# Patient Record
Sex: Female | Born: 1995 | Hispanic: Yes | Marital: Single | Smoking: Never smoker
Health system: Southern US, Community
[De-identification: ages and names within clinical notes are randomized; demographics above are authoritative.]

## PROBLEM LIST (undated history)

## (undated) DIAGNOSIS — L409 Psoriasis, unspecified: Secondary | ICD-10-CM

## (undated) HISTORY — DX: Psoriasis, unspecified: L40.9

---
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
## 2015-03-07 ENCOUNTER — Encounter: Payer: Self-pay | Admitting: Emergency Medicine

## 2015-03-07 ENCOUNTER — Emergency Department: Payer: Self-pay

## 2015-03-07 ENCOUNTER — Emergency Department
Admission: EM | Admit: 2015-03-07 | Discharge: 2015-03-07 | Disposition: A | Discharge status: Home / Self Care | Payer: Self-pay | Attending: Emergency Medicine | Admitting: Emergency Medicine

## 2015-03-07 DIAGNOSIS — W2201XA Walked into wall, initial encounter: Secondary | ICD-10-CM | POA: Insufficient documentation

## 2015-03-07 DIAGNOSIS — S60221A Contusion of right hand, initial encounter: Secondary | ICD-10-CM | POA: Insufficient documentation

## 2015-03-07 DIAGNOSIS — M79641 Pain in right hand: Secondary | ICD-10-CM

## 2015-03-07 DIAGNOSIS — Y998 Other external cause status: Secondary | ICD-10-CM | POA: Insufficient documentation

## 2015-03-07 DIAGNOSIS — Y9389 Activity, other specified: Secondary | ICD-10-CM | POA: Insufficient documentation

## 2015-03-07 DIAGNOSIS — Y9289 Other specified places as the place of occurrence of the external cause: Secondary | ICD-10-CM | POA: Insufficient documentation

## 2015-03-07 NOTE — ED Provider Notes (Signed)
[facility] Emergency Department Provider Note    ____________________________________________  Time seen: 0515  I have reviewed the triage vital signs and the nursing notes.   HISTORY  Chief Complaint Hand Injury   History limited by: Not Limited   HPI Amy Huff is a 19 y.o. female who presents to the emergency department today because of concerns for right hand pain. The patient states she punched a wooden door earlier tonight. She states she was angry. She states that right after punching it she developed pain around her knuckles. She states the pain is worse around her fifth digit knuckle. The pain has been constant. She tried icing it without great relief. She denies any other injuries. She states she has a previous wrist injury to that hand.  History reviewed. No pertinent past medical history.  There are no active problems to display for this patient.   History reviewed. No pertinent past surgical history.  No current outpatient prescriptions on file.  Allergies Review of patient's allergies indicates no known allergies.  No family history on file.  Social History Social History  Substance Use Topics  . Smoking status: Never Smoker   . Smokeless tobacco: Never Used  . Alcohol Use: No    Review of Systems  Constitutional: Negative for fever. Cardiovascular: Negative for chest pain. Respiratory: Negative for shortness of breath. Gastrointestinal: Negative for abdominal pain, vomiting and diarrhea. Neurological: Negative for headaches, focal weakness or numbness.   10-point ROS otherwise negative.  ____________________________________________   PHYSICAL EXAM:  VITAL SIGNS: ED Triage Vitals  Enc Vitals Group     BP 03/07/15 0344 135/87 mmHg     Pulse Rate 03/07/15 0344 83     Resp 03/07/15 0344 16     Temp 03/07/15 0344 98.5 F (36.9 C)     Temp Source 03/07/15 0344 Oral     SpO2 03/07/15 0344 100 %     Weight  03/07/15 0344 160 lb (72.576 kg)     Height 03/07/15 0344 5\' 5"  (1.651 m)     Head Cir --      Peak Flow --      Pain Score 03/07/15 0345 7   Constitutional: Alert and oriented. Well appearing and in no distress. Eyes: Conjunctivae are normal. PERRL. Normal extraocular movements. ENT   Head: Normocephalic and atraumatic.   Nose: No congestion/rhinnorhea.   Mouth/Throat: Mucous membranes are moist.   Neck: No stridor. Hematological/Lymphatic/Immunilogical: No cervical lymphadenopathy. Cardiovascular: Normal rate, regular rhythm.  No murmurs, rubs, or gallops. Respiratory: Normal respiratory effort without tachypnea nor retractions. Breath sounds are clear and equal bilaterally. No wheezes/rales/rhonchi. Musculoskeletal: Some bruising to the third through fifth knuckles of her right hand. Some tenderness to palpation about the fifth knuckle however no gross deformity. Sensation intact. Grip strength intact. Neurologic:  Normal speech and language. No gross focal neurologic deficits are appreciated.  Skin:  Skin is warm, dry and intact. No rash noted. Psychiatric: Mood and affect are normal. Speech and behavior are normal. Patient exhibits appropriate insight and judgment.  ____________________________________________    LABS (pertinent positives/negatives)  None  ____________________________________________   EKG  None  ____________________________________________    RADIOLOGY  CXR IMPRESSION: No evidence of fracture or dislocation.  I, Darnell Jeschke, personally viewed and evaluated these images (plain radiographs) as part of my medical decision making.   ____________________________________________   PROCEDURES  Procedure(s) performed: None  Critical Care performed: No  ____________________________________________   INITIAL IMPRESSION / ASSESSMENT AND PLAN /  ED COURSE  Pertinent labs & imaging results that were available during my care of the  patient were reviewed by me and considered in my medical decision making (see chart for details).  Patient presented to the emergency department today complaining of right hand pain after punching a door. X-ray did not show any acute osseous injuries. No other injuries. Will discharge home.  ____________________________________________   FINAL CLINICAL IMPRESSION(S) / ED DIAGNOSES  Final diagnoses:  Hand pain, right     Phineas Semen, MD 03/07/15 0522

## 2015-03-07 NOTE — Discharge Instructions (Signed)
Please seek medical attention for any high fevers, chest pain, shortness of breath, change in behavior, persistent vomiting, bloody stool or any other new or concerning symptoms. ° ° °Musculoskeletal Pain °Musculoskeletal pain is muscle and boney aches and pains. These pains can occur in any part of the body. Your caregiver may treat you without knowing the cause of the pain. They may treat you if blood or urine tests, X-rays, and other tests were normal.  °CAUSES °There is often not a definite cause or reason for these pains. These pains may be caused by a type of germ (virus). The discomfort may also come from overuse. Overuse includes working out too hard when your body is not fit. Boney aches also come from weather changes. Bone is sensitive to atmospheric pressure changes. °HOME CARE INSTRUCTIONS  °· Ask when your test results will be ready. Make sure you get your test results. °· Only take over-the-counter or prescription medicines for pain, discomfort, or fever as directed by your caregiver. If you were given medications for your condition, do not drive, operate machinery or power tools, or sign legal documents for 24 hours. Do not drink alcohol. Do not take sleeping pills or other medications that may interfere with treatment. °· Continue all activities unless the activities cause more pain. When the pain lessens, slowly resume normal activities. Gradually increase the intensity and duration of the activities or exercise. °· During periods of severe pain, bed rest may be helpful. Lay or sit in any position that is comfortable. °· Putting ice on the injured area. °¨ Put ice in a bag. °¨ Place a towel between your skin and the bag. °¨ Leave the ice on for 15 to 20 minutes, 3 to 4 times a day. °· Follow up with your caregiver for continued problems and no reason can be found for the pain. If the pain becomes worse or does not go away, it may be necessary to repeat tests or do additional testing. Your caregiver  may need to look further for a possible cause. °SEEK IMMEDIATE MEDICAL CARE IF: °· You have pain that is getting worse and is not relieved by medications. °· You develop chest pain that is associated with shortness or breath, sweating, feeling sick to your stomach (nauseous), or throw up (vomit). °· Your pain becomes localized to the abdomen. °· You develop any new symptoms that seem different or that concern you. °MAKE SURE YOU:  °· Understand these instructions. °· Will watch your condition. °· Will get help right away if you are not doing well or get worse. °  °This information is not intended to replace advice given to you by your health care provider. Make sure you discuss any questions you have with your health care provider. °  °Document Released: 02/28/2005 Document Revised: 05/23/2011 Document Reviewed: 11/02/2012 °Elsevier Interactive Patient Education ©2016 [employer] ° °

## 2015-03-07 NOTE — ED Notes (Addendum)
Pt states punched a wood wall approx 1 hour pta. Pt complains of posterior base of right knuckles pain and hand pain. Cms intact to fingers, ice applied in triage.

## 2017-06-15 IMAGING — CR DG HAND COMPLETE 3+V*R*
1 series · 3 of 3 positions shown · non-contrast
Comparison: None.

CLINICAL DATA: Punched Thijs Notenboom, with right fifth metacarpal pain.
Initial encounter.

EXAM:
RIGHT HAND - COMPLETE 3+ VIEW

[Series 1: x hand pa right · 0.14mm/px · 3 of 3 slices shown]
[im 1/3]
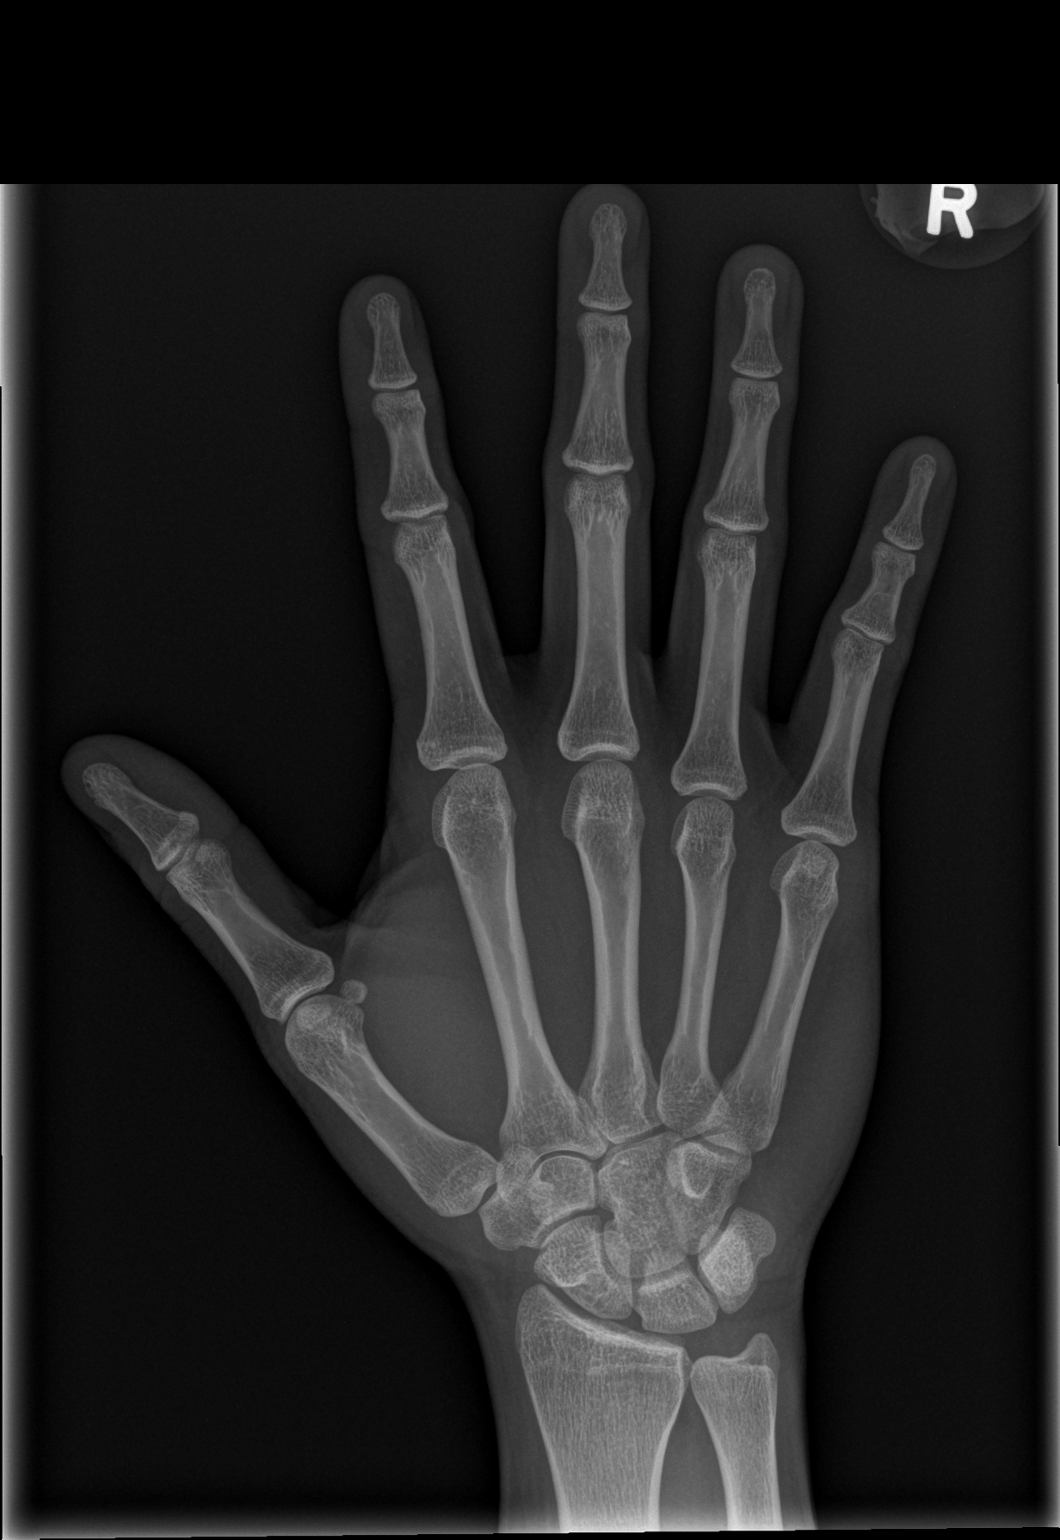
[im 2/3]
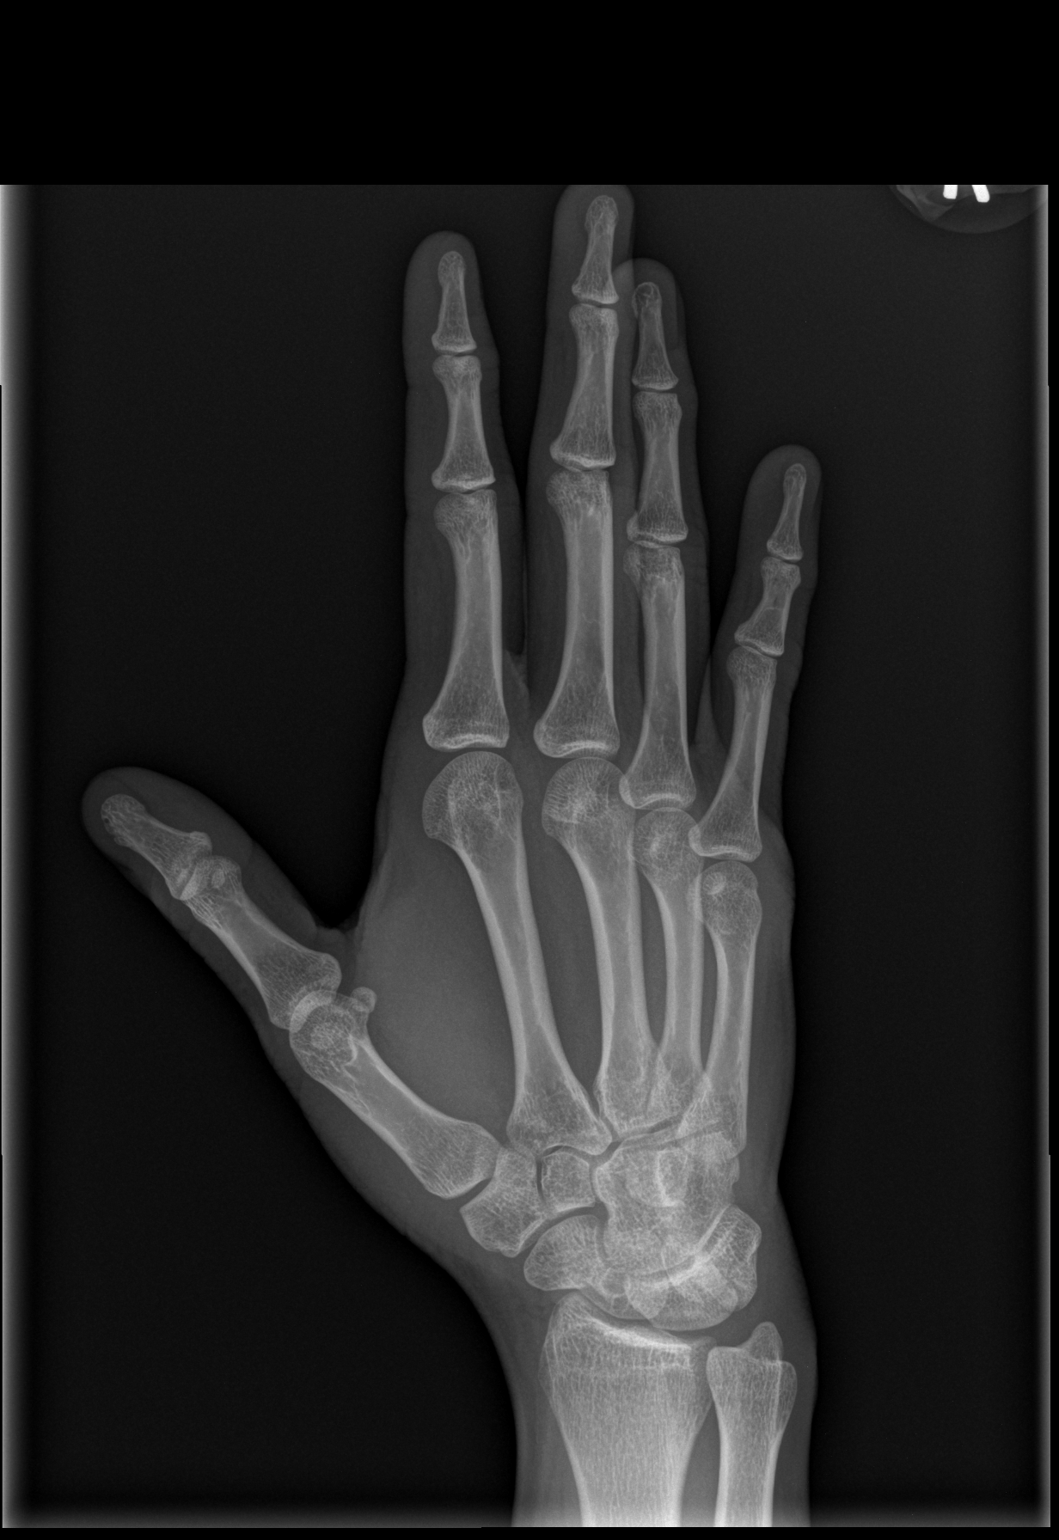
[im 3/3]
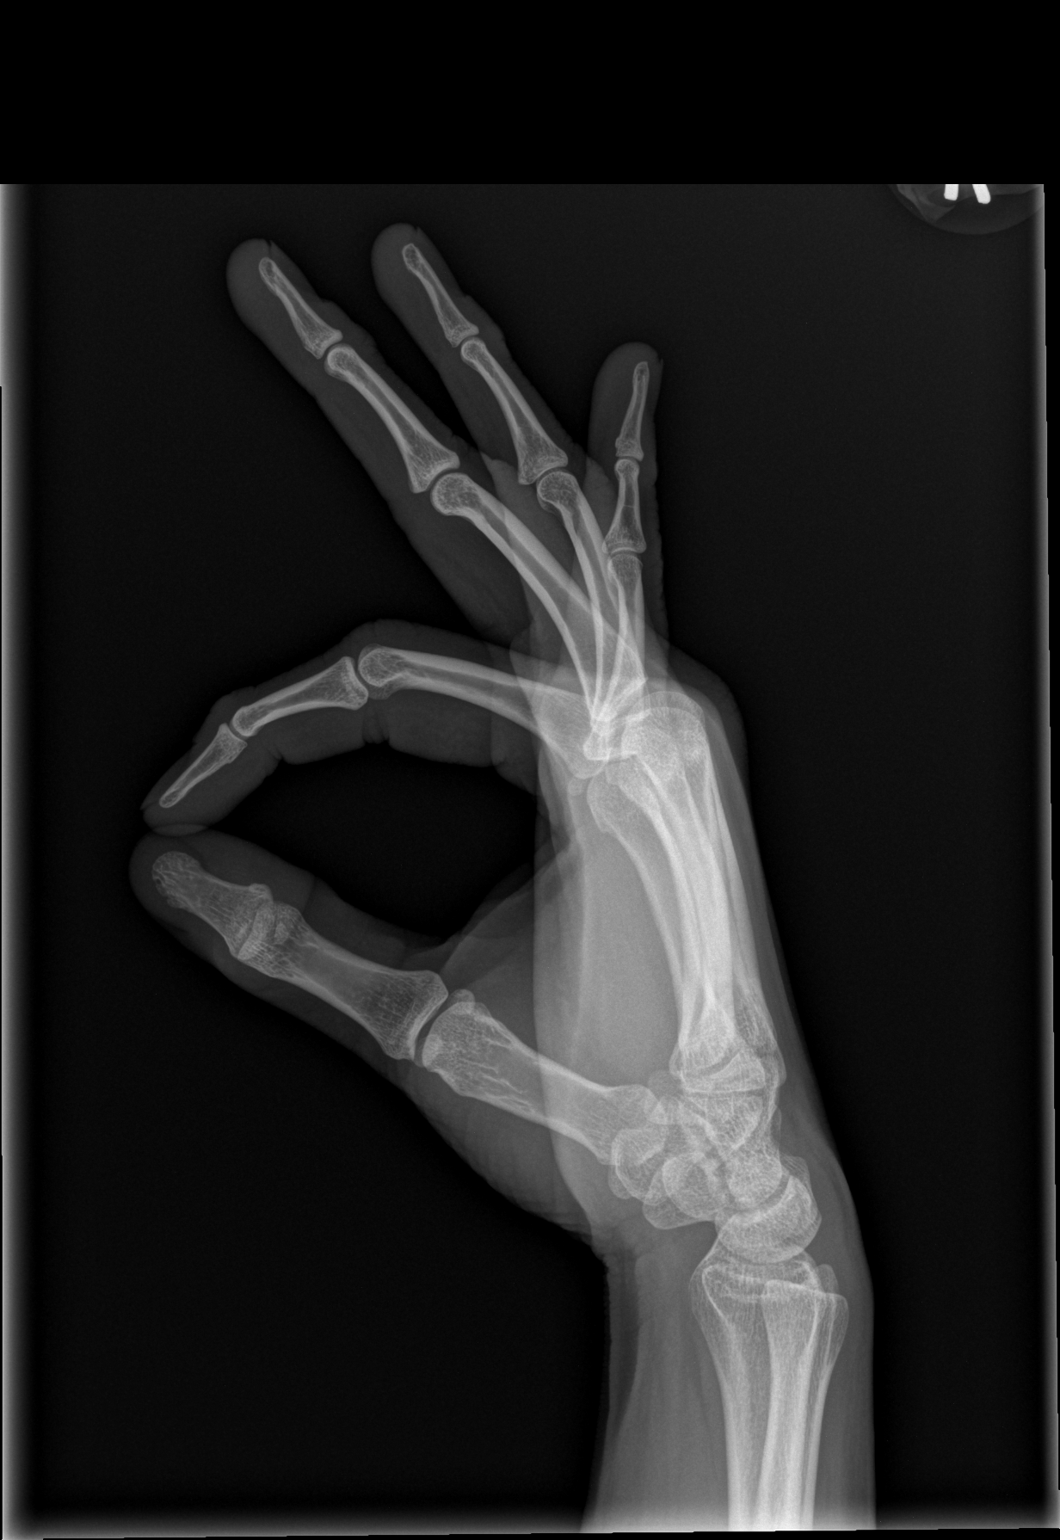

[3 of 3 positions shown; findings below may reference images not displayed]

FINDINGS: There is no evidence of fracture or dislocation. The joint spaces
are preserved. The carpal rows are intact, and demonstrate normal
alignment. The soft tissues are unremarkable in appearance.
IMPRESSION: No evidence of fracture or dislocation.

## 2020-12-03 DIAGNOSIS — Z7689 Persons encountering health services in other specified circumstances: Secondary | ICD-10-CM | POA: Diagnosis not present

## 2020-12-03 DIAGNOSIS — Z79899 Other long term (current) drug therapy: Secondary | ICD-10-CM | POA: Diagnosis not present

## 2020-12-03 DIAGNOSIS — L400 Psoriasis vulgaris: Secondary | ICD-10-CM | POA: Diagnosis not present

## 2020-12-10 DIAGNOSIS — Z7689 Persons encountering health services in other specified circumstances: Secondary | ICD-10-CM | POA: Diagnosis not present

## 2021-04-07 DIAGNOSIS — F4310 Post-traumatic stress disorder, unspecified: Secondary | ICD-10-CM | POA: Diagnosis not present

## 2021-04-13 DIAGNOSIS — Z1329 Encounter for screening for other suspected endocrine disorder: Secondary | ICD-10-CM | POA: Diagnosis not present

## 2021-04-13 DIAGNOSIS — Z130 Encounter for screening for diseases of the blood and blood-forming organs and certain disorders involving the immune mechanism: Secondary | ICD-10-CM | POA: Diagnosis not present

## 2021-04-13 DIAGNOSIS — Z131 Encounter for screening for diabetes mellitus: Secondary | ICD-10-CM | POA: Diagnosis not present

## 2021-04-13 DIAGNOSIS — Z124 Encounter for screening for malignant neoplasm of cervix: Secondary | ICD-10-CM | POA: Diagnosis not present

## 2021-04-13 DIAGNOSIS — Z113 Encounter for screening for infections with a predominantly sexual mode of transmission: Secondary | ICD-10-CM | POA: Diagnosis not present

## 2021-04-13 DIAGNOSIS — Z01419 Encounter for gynecological examination (general) (routine) without abnormal findings: Secondary | ICD-10-CM | POA: Diagnosis not present

## 2021-04-13 DIAGNOSIS — Z13220 Encounter for screening for lipoid disorders: Secondary | ICD-10-CM | POA: Diagnosis not present

## 2021-04-13 DIAGNOSIS — Z1321 Encounter for screening for nutritional disorder: Secondary | ICD-10-CM | POA: Diagnosis not present

## 2021-04-14 DIAGNOSIS — F4310 Post-traumatic stress disorder, unspecified: Secondary | ICD-10-CM | POA: Diagnosis not present

## 2021-04-21 DIAGNOSIS — F4310 Post-traumatic stress disorder, unspecified: Secondary | ICD-10-CM | POA: Diagnosis not present

## 2021-04-28 DIAGNOSIS — F4310 Post-traumatic stress disorder, unspecified: Secondary | ICD-10-CM | POA: Diagnosis not present

## 2021-05-05 DIAGNOSIS — F4310 Post-traumatic stress disorder, unspecified: Secondary | ICD-10-CM | POA: Diagnosis not present

## 2021-05-12 DIAGNOSIS — F4310 Post-traumatic stress disorder, unspecified: Secondary | ICD-10-CM | POA: Diagnosis not present

## 2021-05-19 DIAGNOSIS — F4310 Post-traumatic stress disorder, unspecified: Secondary | ICD-10-CM | POA: Diagnosis not present

## 2021-05-26 DIAGNOSIS — F4310 Post-traumatic stress disorder, unspecified: Secondary | ICD-10-CM | POA: Diagnosis not present

## 2021-06-09 DIAGNOSIS — F4310 Post-traumatic stress disorder, unspecified: Secondary | ICD-10-CM | POA: Diagnosis not present

## 2021-06-30 DIAGNOSIS — Z79899 Other long term (current) drug therapy: Secondary | ICD-10-CM | POA: Diagnosis not present

## 2021-06-30 DIAGNOSIS — L400 Psoriasis vulgaris: Secondary | ICD-10-CM | POA: Diagnosis not present

## 2021-06-30 DIAGNOSIS — F4310 Post-traumatic stress disorder, unspecified: Secondary | ICD-10-CM | POA: Diagnosis not present

## 2021-07-07 DIAGNOSIS — F4310 Post-traumatic stress disorder, unspecified: Secondary | ICD-10-CM | POA: Diagnosis not present

## 2021-07-14 DIAGNOSIS — F4310 Post-traumatic stress disorder, unspecified: Secondary | ICD-10-CM | POA: Diagnosis not present

## 2021-07-20 DIAGNOSIS — R509 Fever, unspecified: Secondary | ICD-10-CM | POA: Diagnosis not present

## 2021-07-20 DIAGNOSIS — J36 Peritonsillar abscess: Secondary | ICD-10-CM | POA: Diagnosis not present

## 2021-07-20 DIAGNOSIS — Z20822 Contact with and (suspected) exposure to covid-19: Secondary | ICD-10-CM | POA: Diagnosis not present

## 2021-07-28 DIAGNOSIS — F4310 Post-traumatic stress disorder, unspecified: Secondary | ICD-10-CM | POA: Diagnosis not present

## 2021-08-11 DIAGNOSIS — F4310 Post-traumatic stress disorder, unspecified: Secondary | ICD-10-CM | POA: Diagnosis not present

## 2021-09-01 DIAGNOSIS — F4310 Post-traumatic stress disorder, unspecified: Secondary | ICD-10-CM | POA: Diagnosis not present

## 2021-09-22 DIAGNOSIS — F4310 Post-traumatic stress disorder, unspecified: Secondary | ICD-10-CM | POA: Diagnosis not present

## 2021-10-06 DIAGNOSIS — F4310 Post-traumatic stress disorder, unspecified: Secondary | ICD-10-CM | POA: Diagnosis not present

## 2021-10-27 DIAGNOSIS — F4310 Post-traumatic stress disorder, unspecified: Secondary | ICD-10-CM | POA: Diagnosis not present

## 2021-11-17 DIAGNOSIS — F4310 Post-traumatic stress disorder, unspecified: Secondary | ICD-10-CM | POA: Diagnosis not present

## 2021-12-08 DIAGNOSIS — F4310 Post-traumatic stress disorder, unspecified: Secondary | ICD-10-CM | POA: Diagnosis not present

## 2021-12-29 DIAGNOSIS — F4310 Post-traumatic stress disorder, unspecified: Secondary | ICD-10-CM | POA: Diagnosis not present

## 2022-01-18 DIAGNOSIS — L400 Psoriasis vulgaris: Secondary | ICD-10-CM | POA: Diagnosis not present

## 2022-01-18 DIAGNOSIS — Z79899 Other long term (current) drug therapy: Secondary | ICD-10-CM | POA: Diagnosis not present

## 2022-01-19 DIAGNOSIS — L400 Psoriasis vulgaris: Secondary | ICD-10-CM | POA: Diagnosis not present

## 2022-01-19 DIAGNOSIS — Z79899 Other long term (current) drug therapy: Secondary | ICD-10-CM | POA: Diagnosis not present

## 2022-01-26 DIAGNOSIS — F4310 Post-traumatic stress disorder, unspecified: Secondary | ICD-10-CM | POA: Diagnosis not present

## 2022-02-16 DIAGNOSIS — F4310 Post-traumatic stress disorder, unspecified: Secondary | ICD-10-CM | POA: Diagnosis not present

## 2022-02-18 ENCOUNTER — Ambulatory Visit
Admission: EM | Admit: 2022-02-18 | Discharge: 2022-02-18 | Disposition: A | Discharge status: Home / Self Care | Payer: BC Managed Care – PPO

## 2022-02-18 DIAGNOSIS — R0981 Nasal congestion: Secondary | ICD-10-CM | POA: Diagnosis not present

## 2022-02-18 DIAGNOSIS — R051 Acute cough: Secondary | ICD-10-CM | POA: Diagnosis not present

## 2022-02-18 DIAGNOSIS — H6502 Acute serous otitis media, left ear: Secondary | ICD-10-CM

## 2022-02-18 MED ORDER — AMOXICILLIN-POT CLAVULANATE 875-125 MG PO TABS: 1.0000 | ORAL_TABLET | Freq: Two times a day (BID) | ORAL | 0 refills | Status: AC

## 2022-02-18 NOTE — ED Provider Notes (Signed)
[facility]    CSN: [id number] Arrival date & time: 02/18/22  1045      History   Chief Complaint Chief Complaint  Patient presents with   Ear Pain   Nasal Congestion    HPI Amy Huff is a 26 y.o. female presenting for little greater than 1 week history of left-sided ear pain/pressure, nasal congestion, cough, sinus pressure, sore throat, reduced hearing in the left ear.  Reports symptoms recently worsened over the past couple days.  Has been taking decongestants and occasionally using nasal spray over-the-counter without improvement.  No other complaints.  HPI  History reviewed. No pertinent past medical history.  There are no problems to display for this patient.   History reviewed. No pertinent surgical history.  OB History   No obstetric history on file.      Home Medications    Prior to Admission medications   Medication Sig Start Date End Date Taking? Authorizing Provider  amoxicillin-clavulanate (AUGMENTIN) 875-125 MG tablet Take 1 tablet by mouth every 12 (twelve) hours for 7 days. 02/18/22 02/25/22 Yes Danton Clap, PA-C  Certolizumab Pegol (CIMZIA STARTER KIT) 6 X 200 MG/ML PSKT Cimzia 400 mg/2 mL (200 mg/mL x 2) subcutaneous syringe kit 01/14/21  Yes [provider]  levonorgestrel-ethinyl estradiol (DOLISHALE) 90-20 MCG tablet Take 1 tablet by mouth daily.   Yes [provider]  Vitamin D, Ergocalciferol, (DRISDOL) 1.25 MG (50000 UNIT) CAPS capsule Take 50,000 Units by mouth once a week. 02/13/22  Yes [provider]    Family History History reviewed. No pertinent family history.  Social History Social History   Tobacco Use   Smoking status: Never   Smokeless tobacco: Never  Vaping Use   Vaping Use: Never used  Substance Use Topics   Alcohol use: No   Drug use: No     Allergies   Patient has no known allergies.   Review of Systems Review of Systems  Constitutional:  Negative for chills,  diaphoresis, fatigue and fever.  HENT:  Positive for congestion, ear pain, hearing loss, postnasal drip, rhinorrhea and sore throat. Negative for sinus pressure and sinus pain.   Respiratory:  Positive for cough. Negative for shortness of breath.   Gastrointestinal:  Negative for abdominal pain, nausea and vomiting.  Musculoskeletal:  Negative for arthralgias and myalgias.  Skin:  Negative for rash.  Neurological:  Negative for weakness and headaches.  Hematological:  Negative for adenopathy.     Physical Exam Triage Vital Signs ED Triage Vitals  Enc Vitals Group     BP 02/18/22 1210 (!) 147/97     Pulse Rate 02/18/22 1210 82     Resp 02/18/22 1210 18     Temp 02/18/22 1210 98.3 F (36.8 C)     Temp Source 02/18/22 1210 Oral     SpO2 02/18/22 1210 97 %     Weight 02/18/22 1208 180 lb (81.6 kg)     Height 02/18/22 1208 _0  (1.651 m)     Head Circumference --      Peak Flow --      Pain Score 02/18/22 1207 6     Pain Loc --      Pain Edu? --      Excl. in [place]? --    No data found.  Updated Vital Signs BP (!) 147/97 (BP Location: Left Arm)   Pulse 82   Temp 98.3 F (36.8 C) (Oral)   Resp 18   Ht _1  (  1.651 m)   Wt 180 lb (81.6 kg)   SpO2 97%   BMI 29.95 kg/m     Physical Exam Vitals and nursing note reviewed.  Constitutional:      General: She is not in acute distress.    Appearance: Normal appearance. She is not ill-appearing or toxic-appearing.  HENT:     Head: Normocephalic and atraumatic.     Right Ear: Ear canal and external ear normal. A middle ear effusion is present.     Left Ear: Ear canal and external ear normal. A middle ear effusion is present. Tympanic membrane is injected and bulging.     Nose: Congestion present.     Mouth/Throat:     Mouth: Mucous membranes are moist.     Pharynx: Oropharynx is clear. No posterior oropharyngeal erythema.  Eyes:     General: No scleral icterus.       Right eye: No discharge.        Left eye: No discharge.      Conjunctiva/sclera: Conjunctivae normal.  Cardiovascular:     Rate and Rhythm: Normal rate and regular rhythm.     Heart sounds: Normal heart sounds.  Pulmonary:     Effort: Pulmonary effort is normal. No respiratory distress.     Breath sounds: Normal breath sounds.  Musculoskeletal:     Cervical back: Neck supple.  Skin:    General: Skin is dry.  Neurological:     General: No focal deficit present.     Mental Status: She is alert. Mental status is at baseline.     Motor: No weakness.     Gait: Gait normal.  Psychiatric:        Mood and Affect: Mood normal.        Behavior: Behavior normal.        Thought Content: Thought content normal.      UC Treatments / Results  Labs (all labs ordered are listed, but only abnormal results are displayed) Labs Reviewed - No data to display  EKG   Radiology No results found.  Procedures Procedures (including critical care time)  Medications Ordered in UC Medications - No data to display  Initial Impression / Assessment and Plan / UC Course  I have reviewed the triage vital signs and the nursing notes.  Pertinent labs & imaging results that were available during my care of the patient were reviewed by me and considered in my medical decision making (see chart for details).   26 year old female presenting for greater than 1 week history of left-sided ear pain/pressure, sore throat, cough, nasal congestion and sinus pressure.  Exam she has erythema mildly/injection of the left TM with clear effusion and bulging TM.  Also has nasal congestion and clear effusion on the right.  Suspect viral URI and acute serous otitis media which I discussed with patient.  Advised her to continue the decongestants and begin using Flonase and nasal saline every day.  I printed a prescription for Augmentin in case her symptoms are not improving over the next week or they worsen.   Final Clinical Impressions(s) / UC Diagnoses   Final diagnoses:   Acute serous otitis media of left ear, recurrence not specified  Acute cough  Nasal congestion     Discharge Instructions      -Symptoms most likely due to a viral.  Suggest continuing Sudafed and starting Flonase every day and nasal saline rinses.  I printed prescription for antibiotic in case you are not starting  to improve the next 2 days or symptoms worsen.  As we discussed, lower suspicion for bacterial ear infection at this time.     ED Prescriptions     Medication Sig Dispense Auth. Provider   amoxicillin-clavulanate (AUGMENTIN) 875-125 MG tablet Take 1 tablet by mouth every 12 (twelve) hours for 7 days. 14 tablet Gretta Cool      PDMP not reviewed this encounter.   Danton Clap, PA-C 02/18/22 1248

## 2022-02-18 NOTE — ED Triage Notes (Signed)
Pt c/o congestion and nasal draiange, cough, sore throat, ear pain and loss of hearing x1week

## 2022-02-18 NOTE — Discharge Instructions (Signed)
-  Symptoms most likely due to a viral.  Suggest continuing Sudafed and starting Flonase every day and nasal saline rinses.  I printed prescription for antibiotic in case you are not starting to improve the next 2 days or symptoms worsen.  As we discussed, lower suspicion for bacterial ear infection at this time.

## 2022-03-23 DIAGNOSIS — F4310 Post-traumatic stress disorder, unspecified: Secondary | ICD-10-CM | POA: Diagnosis not present

## 2022-04-13 DIAGNOSIS — F4310 Post-traumatic stress disorder, unspecified: Secondary | ICD-10-CM | POA: Diagnosis not present

## 2022-04-14 DIAGNOSIS — Z0000 Encounter for general adult medical examination without abnormal findings: Secondary | ICD-10-CM | POA: Diagnosis not present

## 2022-04-14 LAB — HM PAP SMEAR: HM Pap smear: NORMAL

## 2022-04-21 ENCOUNTER — Ambulatory Visit: Payer: BC Managed Care – PPO | Admitting: Family Medicine

## 2022-04-21 ENCOUNTER — Encounter: Payer: Self-pay | Admitting: Family Medicine

## 2022-04-21 VITALS — BP 128/78 | HR 100 | Ht 65.0 in | Wt 183.0 lb

## 2022-04-21 DIAGNOSIS — L400 Psoriasis vulgaris: Secondary | ICD-10-CM | POA: Diagnosis not present

## 2022-04-21 DIAGNOSIS — Z1159 Encounter for screening for other viral diseases: Secondary | ICD-10-CM

## 2022-04-21 DIAGNOSIS — E559 Vitamin D deficiency, unspecified: Secondary | ICD-10-CM | POA: Diagnosis not present

## 2022-04-21 DIAGNOSIS — Z13220 Encounter for screening for lipoid disorders: Secondary | ICD-10-CM | POA: Diagnosis not present

## 2022-04-21 DIAGNOSIS — Z0000 Encounter for general adult medical examination without abnormal findings: Secondary | ICD-10-CM | POA: Diagnosis not present

## 2022-04-21 DIAGNOSIS — Z114 Encounter for screening for human immunodeficiency virus [HIV]: Secondary | ICD-10-CM | POA: Diagnosis not present

## 2022-04-21 NOTE — Assessment & Plan Note (Signed)
Well controlled on current regimen, will follow peripherally.

## 2022-04-21 NOTE — Patient Instructions (Signed)
-   Obtain fasting labs with orders provided (can have water or black coffee but otherwise no food or drink x 8 hours before labs) °- Review information provided °- Attend eye doctor annually, dentist every 6 months, work towards or maintain 30 minutes of moderate intensity physical activity at least 5 days per week, and consume a balanced diet °- Return in 1 year for physical °- Contact us for any questions between now and then °

## 2022-04-21 NOTE — Progress Notes (Signed)
Annual Physical Exam Visit  Patient Information:  Patient ID: Amy Huff, female DOB: February 21, 1996 Age: 27 y.o. MRN: [id number]   Subjective:   CC: Annual Physical Exam  HPI:  Amy Huff is here for their annual physical.  I reviewed the past medical history, family history, social history, surgical history, and allergies today and changes were made as necessary.  Please see the problem list section below for additional details.  Past Medical History: Past Medical History:  Diagnosis Date   Psoriasis    Past Surgical History: Past Surgical History:  Procedure Laterality Date   WISDOM TOOTH EXTRACTION Bilateral 2012   Family History: Family History  Problem Relation Age of Onset   Diabetes Mother    Cancer Maternal Grandmother    Dementia Paternal Grandfather    Allergies: No Known Allergies Health Maintenance: Health Maintenance  Topic Date Due   COVID-19 Vaccine (1) 08/26/2019   INFLUENZA VACCINE  Never done   Hepatitis C Screening  04/22/2023 (Originally 09/11/2012)   PAP-Cervical Cytology Screening  04/14/2025   PAP SMEAR-Modifier  04/14/2025   HIV Screening  Completed   HPV VACCINES  Aged Out   DTaP/Tdap/Td  Discontinued    HM Colonoscopy     This patient has no relevant Health Maintenance data.      Medications: Current Outpatient Medications on File Prior to Visit  Medication Sig Dispense Refill   Certolizumab Pegol (CIMZIA STARTER KIT) 6 X 200 MG/ML PSKT Cimzia 400 mg/2 mL (200 mg/mL x 2) subcutaneous syringe kit     levonorgestrel-ethinyl estradiol (DOLISHALE) 90-20 MCG tablet Take 1 tablet by mouth daily.     Vitamin D, Ergocalciferol, (DRISDOL) 1.25 MG (50000 UNIT) CAPS capsule Take 50,000 Units by mouth once a week.     No current facility-administered medications on file prior to visit.    Review of Systems: No headache, visual changes, nausea, vomiting, diarrhea, constipation, dizziness, abdominal pain, skin rash, fevers, chills, night  sweats, swollen lymph nodes, weight loss, chest pain, body aches, joint swelling, muscle aches, shortness of breath, mood changes, visual or auditory hallucinations reported.  Objective:   Vitals:   04/21/22 0912 04/21/22 0920  BP: (!) 140/90 128/78  Pulse: 100   SpO2: 97%    Vitals:   04/21/22 0912  Weight: 183 lb (83 kg)  Height: 5\' 5"  (1.651 m)   Body mass index is 30.45 kg/m.  General: Well Developed, well nourished, and in no acute distress.  Neuro: Alert and oriented x3, extra-ocular muscles intact, sensation grossly intact. Cranial nerves II through XII are grossly intact, motor, sensory, and coordinative functions are intact. HEENT: Normocephalic, atraumatic, pupils equal round reactive to light, neck supple, no masses, no lymphadenopathy, thyroid nonpalpable. Oropharynx, nasopharynx, external ear canals are unremarkable. Left TM with some subtle focal areas of scarring. Skin: Warm and dry, no rashes noted.  Cardiac: Regular rate and rhythm, no murmurs rubs or gallops. No peripheral edema. Pulses symmetric. Respiratory: Clear to auscultation bilaterally. Not using accessory muscles, speaking in full sentences.  Abdominal: Soft, nontender, nondistended, positive bowel sounds, no masses, no organomegaly. Musculoskeletal: Shoulder, elbow, wrist, hip, knee, ankle stable, and with full range of motion.  Female chaperone initials: TL present throughout the physical examination.  Impression and Recommendations:   The patient was counselled, risk factors were discussed, and anticipatory guidance given.  Problem List Items Addressed This Visit       Musculoskeletal and Integument   Plaque psoriasis    Well controlled on  current regimen, will follow peripherally.      Relevant Orders   CBC   Comprehensive metabolic panel     Other   Annual physical exam - Primary    Annual examination completed, risk stratification labs ordered, anticipatory guidance provided.  We will  follow labs once resulted.      Relevant Orders   CBC   Comprehensive metabolic panel   Hepatitis C antibody   HIV Antibody (routine testing w rflx)   Lipid panel   TSH   VITAMIN D 25 Hydroxy (Vit-D Deficiency, Fractures)   Other Visit Diagnoses     Screening for HIV (human immunodeficiency virus)       Relevant Orders   HIV Antibody (routine testing w rflx)   Need for hepatitis C screening test       Relevant Orders   Hepatitis C antibody   Screening for lipoid disorders       Relevant Orders   Comprehensive metabolic panel   Lipid panel   Vitamin D deficiency       Relevant Orders   VITAMIN D 25 Hydroxy (Vit-D Deficiency, Fractures)        Orders & Medications Medications: No orders of the defined types were placed in this encounter.  Orders Placed This Encounter  Procedures   HM PAP SMEAR   CBC   Comprehensive metabolic panel   Hepatitis C antibody   HIV Antibody (routine testing w rflx)   Lipid panel   TSH   VITAMIN D 25 Hydroxy (Vit-D Deficiency, Fractures)     Return in about 1 year (around 04/22/2023) for CPE.    Montel Culver, MD, [facility]   Primary Care Sports Medicine Primary Care and Sports Medicine at [facility]

## 2022-04-21 NOTE — Assessment & Plan Note (Signed)
Annual examination completed, risk stratification labs ordered, anticipatory guidance provided.  We will follow labs once resulted. 

## 2022-04-22 LAB — COMPREHENSIVE METABOLIC PANEL
ALT: 38 IU/L — ABNORMAL HIGH (ref 0–32)
AST: 23 IU/L (ref 0–40)
Albumin/Globulin Ratio: 1.3 (ref 1.2–2.2)
Albumin: 4.3 g/dL (ref 4.0–5.0)
Alkaline Phosphatase: 48 IU/L (ref 44–121)
BUN/Creatinine Ratio: 15 (ref 9–23)
BUN: 12 mg/dL (ref 6–20)
Bilirubin Total: 0.3 mg/dL (ref 0.0–1.2)
CO2: 21 mmol/L (ref 20–29)
Calcium: 9.5 mg/dL (ref 8.7–10.2)
Chloride: 105 mmol/L (ref 96–106)
Creatinine, Ser: 0.78 mg/dL (ref 0.57–1.00)
Globulin, Total: 3.3 g/dL (ref 1.5–4.5)
Glucose: 85 mg/dL (ref 70–99)
Potassium: 4.7 mmol/L (ref 3.5–5.2)
Sodium: 143 mmol/L (ref 134–144)
Total Protein: 7.6 g/dL (ref 6.0–8.5)
eGFR: 107 mL/min/{1.73_m2} (ref >59)

## 2022-04-22 LAB — CBC
Hematocrit: 38 % (ref 34.0–46.6)
Hemoglobin: 12.9 g/dL (ref 11.1–15.9)
MCH: 29.7 pg (ref 26.6–33.0)
MCHC: 33.9 g/dL (ref 31.5–35.7)
MCV: 87 fL (ref 79–97)
Platelets: 367 10*3/uL (ref 150–450)
RBC: 4.35 x10E6/uL (ref 3.77–5.28)
RDW: 13.1 % (ref 11.7–15.4)
WBC: 11.3 10*3/uL — ABNORMAL HIGH (ref 3.4–10.8)

## 2022-04-22 LAB — TSH: TSH: 1.64 u[IU]/mL (ref 0.450–4.500)

## 2022-04-22 LAB — LIPID PANEL
Chol/HDL Ratio: 3.1 ratio (ref 0.0–4.4)
Cholesterol, Total: 163 mg/dL (ref 100–199)
HDL: 52 mg/dL (ref >39)
LDL Chol Calc (NIH): 91 mg/dL (ref 0–99)
Triglycerides: 108 mg/dL (ref 0–149)
VLDL Cholesterol Cal: 20 mg/dL (ref 5–40)

## 2022-04-22 LAB — VITAMIN D 25 HYDROXY (VIT D DEFICIENCY, FRACTURES): Vit D, 25-Hydroxy: 44.2 ng/mL (ref 30.0–100.0)

## 2022-04-22 LAB — HEPATITIS C ANTIBODY: Hep C Virus Ab: NONREACTIVE

## 2022-04-22 LAB — HIV ANTIBODY (ROUTINE TESTING W REFLEX): HIV Screen 4th Generation wRfx: NONREACTIVE

## 2022-05-04 DIAGNOSIS — F4310 Post-traumatic stress disorder, unspecified: Secondary | ICD-10-CM | POA: Diagnosis not present

## 2022-05-25 DIAGNOSIS — F4310 Post-traumatic stress disorder, unspecified: Secondary | ICD-10-CM | POA: Diagnosis not present

## 2022-06-08 DIAGNOSIS — F4310 Post-traumatic stress disorder, unspecified: Secondary | ICD-10-CM | POA: Diagnosis not present

## 2022-06-29 DIAGNOSIS — F4310 Post-traumatic stress disorder, unspecified: Secondary | ICD-10-CM | POA: Diagnosis not present

## 2022-07-19 DIAGNOSIS — Z79899 Other long term (current) drug therapy: Secondary | ICD-10-CM | POA: Diagnosis not present

## 2022-07-19 DIAGNOSIS — L400 Psoriasis vulgaris: Secondary | ICD-10-CM | POA: Diagnosis not present

## 2022-08-03 DIAGNOSIS — F4310 Post-traumatic stress disorder, unspecified: Secondary | ICD-10-CM | POA: Diagnosis not present

## 2022-08-17 DIAGNOSIS — F4310 Post-traumatic stress disorder, unspecified: Secondary | ICD-10-CM | POA: Diagnosis not present

## 2022-09-07 DIAGNOSIS — F4310 Post-traumatic stress disorder, unspecified: Secondary | ICD-10-CM | POA: Diagnosis not present

## 2022-10-25 ENCOUNTER — Ambulatory Visit: Payer: Self-pay | Admitting: *Deleted

## 2022-10-25 NOTE — Telephone Encounter (Signed)
Home care advise provided, self isolation guidelines reviewed. Pt verbalizes understanding. Pt is interested in Paxlovid. Would like to keep appt tomorrow, change to virtual.

## 2022-10-25 NOTE — Telephone Encounter (Signed)
CAlled pt back. Covid positive.

## 2022-10-25 NOTE — Telephone Encounter (Signed)
Summary: Per agent:  "congested/headache/sore throat/fever of 100   Patient called in with headache, congestion, sore throat & fever of 100,   Patient requested an appointment with PCP tomorrow morning,10/26/2022, so I went ahead and scheduled this for her with Dr Joseph Berkshire."   Patients callback # [phone]            Chief Complaint: Congestion Symptoms: Sore throat, cough, body aches, 99.0-100.4 fever, frontal headache Frequency: Yesterday  Pertinent Negatives: Patient denies SOB Disposition: [] ED /[] Urgent Care (no appt availability in office) / [x] Appointment(In office/virtual)/ []  [facility]/ [] Home Care/ [] Refused Recommended Disposition /[] [facility]/ []  Follow-up with PCP Additional Notes: Appt secured for tomorrow AM prior to triage. Advised Covid test, pt has home test. NT will CB 0935 for results. Reason for Disposition  [1] Continuous (nonstop) coughing interferes with work or school AND [2] no improvement using cough treatment per Care Advice    Congestion  Answer Assessment - Initial Assessment Questions 1. ONSET: "When did the cough begin?"      Yesterday fever and headache 2. SEVERITY: "How bad is the cough today?"      Moderate 3. SPUTUM: "Describe the color of your sputum" (none, dry cough; clear, white, yellow, green)     Scant, yellowish 4. HEMOPTYSIS: "Are you coughing up any blood?" If so ask: "How much?" (flecks, streaks, tablespoons, etc.)     no 5. DIFFICULTY BREATHING: "Are you having difficulty breathing?" If Yes, ask: "How bad is it?" (e.g., mild, moderate, severe)    - MILD: No SOB at rest, mild SOB with walking, speaks normally in sentences, can lie down, no retractions, pulse < 100.    - MODERATE: SOB at rest, SOB with minimal exertion and prefers to sit, cannot lie down flat, speaks in phrases, mild retractions, audible wheezing, pulse 100-120.    - SEVERE: Very SOB at rest, speaks in single words, struggling to  breathe, sitting hunched forward, retractions, pulse > 120      no 6. FEVER: "Do you have a fever?" If Yes, ask: "What is your temperature, how was it measured, and when did it start?"     99.0-100.0  10. OTHER SYMPTOMS: "Do you have any other symptoms?" (e.g., runny nose, wheezing, chest pain)       Sore throat,body aches,sinus "Headache" at forehead  Protocols used: Cough - Acute Non-Productive-A-AH

## 2022-10-26 ENCOUNTER — Encounter: Payer: Self-pay | Admitting: Family Medicine

## 2022-10-26 ENCOUNTER — Telehealth: Payer: BC Managed Care – PPO | Admitting: Family Medicine

## 2022-10-26 DIAGNOSIS — U071 COVID-19: Secondary | ICD-10-CM | POA: Diagnosis not present

## 2022-10-26 MED ORDER — NIRMATRELVIR/RITONAVIR (PAXLOVID)TABLET: 3.0000 | ORAL_TABLET | Freq: Two times a day (BID) | ORAL | 0 refills | Status: AC

## 2022-10-26 MED ORDER — METHYLPREDNISOLONE 4 MG PO TBPK: ORAL_TABLET | ORAL | 0 refills | Status: DC

## 2022-10-26 NOTE — Progress Notes (Signed)
Primary Care / Sports Medicine Virtual Visit  Patient Information:  Patient ID: Amy Huff, female DOB: 08-11-1995 Age: 27 y.o. MRN: [id number]   Amy Huff is a pleasant 27 y.o. female presenting with the following:  Chief Complaint  Patient presents with   Covid Positive    Tested positive yesterday, congestion headache sore throat fever chills started 2 days    Review of Systems: No fevers, chills, night sweats, weight loss, chest pain, or shortness of breath.   Patient Active Problem List   Diagnosis Date Noted   COVID 10/26/2022   Annual physical exam 04/21/2022   Plaque psoriasis 2014   Past Medical History:  Diagnosis Date   Psoriasis    Outpatient Encounter Medications as of 10/26/2022  Medication Sig   Certolizumab Pegol (CIMZIA STARTER KIT) 6 X 200 MG/ML PSKT Cimzia 400 mg/2 mL (200 mg/mL x 2) subcutaneous syringe kit   levonorgestrel-ethinyl estradiol (DOLISHALE) 90-20 MCG tablet Take 1 tablet by mouth daily.   methylPREDNISolone (MEDROL DOSEPAK) 4 MG TBPK tablet Take for full course per package instructions   nirmatrelvir/ritonavir (PAXLOVID) 20 x 150 MG & 10 x 100MG  TABS Take 3 tablets by mouth 2 (two) times daily for 5 days. (Take nirmatrelvir 150 mg two tablets twice daily for 5 days and ritonavir 100 mg one tablet twice daily for 5 days) Patient GFR is 107   [DISCONTINUED] Vitamin D, Ergocalciferol, (DRISDOL) 1.25 MG (50000 UNIT) CAPS capsule Take 50,000 Units by mouth once a week.   No facility-administered encounter medications on file as of 10/26/2022.   Past Surgical History:  Procedure Laterality Date   WISDOM TOOTH EXTRACTION Bilateral 2012    Virtual Visit via MyChart Video:   I connected with Amy Huff on 10/26/22 via MyChart Video and verified that I am speaking with the correct person using appropriate identifiers.   The limitations, risks, security and privacy concerns of performing an evaluation and management service by MyChart  Video, including the higher likelihood of inaccurate diagnoses and treatments, and the availability of in person appointments were reviewed. The possible need of an additional face-to-face encounter for complete and high quality delivery of care was discussed. The patient was also made aware that there may be a patient responsible charge related to this service. The patient expressed understanding and wishes to proceed.  Provider location is in medical facility. Patient location is at their home, different from provider location. People involved in care of the patient during this telehealth encounter were myself, my nurse/medical assistant, and my front office/scheduling team member.  Objective findings:   General: Speaking full sentences, no audible heavy breathing. Sounds alert and appropriately interactive. Well-appearing. Face symmetric. Extraocular movements intact. Pupils equal and round. No nasal flaring or accessory muscle use visualized.  Independent interpretation of notes and tests performed by another provider:   None  Pertinent History, Exam, Impression, and Recommendations:   COVID Symptoms started 3 days prior (Sunday 8/11), noted 100.4 tmax Monday night. Having significant aches, chills, congestion. Has had COVID 2 times prior, most recent episode was far more severe. Denies significant SOA.  We reviewed risk stratification and treatment options.  Plan: - Start Paxlovid - Incorporate NSAID regimen for symptom control - Continue OTC medications - Can advance to Medrol if symptoms fail to improve otherwise - Work and return guidance provided - Contact PRN  Orders & Medications Meds ordered this encounter  Medications   nirmatrelvir/ritonavir (PAXLOVID) 20 x 150 MG & 10 x 100MG  TABS  Sig: Take 3 tablets by mouth 2 (two) times daily for 5 days. (Take nirmatrelvir 150 mg two tablets twice daily for 5 days and ritonavir 100 mg one tablet twice daily for 5 days) Patient  GFR is 107    Dispense:  30 tablet    Refill:  0   methylPREDNISolone (MEDROL DOSEPAK) 4 MG TBPK tablet    Sig: Take for full course per package instructions    Dispense:  21 tablet    Refill:  0   No orders of the defined types were placed in this encounter.    I discussed the above assessment and treatment plan with the patient. The patient was provided an opportunity to ask questions and all were answered. The patient agreed with the plan and demonstrated an understanding of the instructions.   The patient was advised to call back or seek an in-person evaluation if the symptoms worsen or if the condition fails to improve as anticipated.   I provided a total time of 30 minutes including both face-to-face and non-face-to-face time on 10/26/2022 inclusive of time utilized for medical chart review, information gathering, care coordination with staff, and documentation completion.    Jerrol Banana, MD, [facility]   Primary Care Sports Medicine Primary Care and Sports Medicine at [facility]

## 2022-10-26 NOTE — Patient Instructions (Addendum)
-   Start Paxlovid and take for full course - Use backup contraception (condoms / abstinence / etc) while on Paxlovid as it can decrease your birth control effectiveness - Continue Dayquil / Nyquil - Start NSAID (ibuprofen three times daily OR naproxen twice daily) - If symptoms are not improving in a few days, start Medrol Dosepak (steroids) and take for full course - Get plenty of rest, hydration, and consider vitamins D, C, and Zinc - Out of work note provided - Review below

## 2022-10-26 NOTE — Assessment & Plan Note (Signed)
Symptoms started 3 days prior (Sunday 8/11), noted 100.4 tmax Monday night. Having significant aches, chills, congestion. Has had COVID 2 times prior, most recent episode was far more severe. Denies significant SOA.  We reviewed risk stratification and treatment options.  Plan: - Start Paxlovid - Incorporate NSAID regimen for symptom control - Continue OTC medications - Can advance to Medrol if symptoms fail to improve otherwise - Work and return guidance provided - Contact PRN

## 2023-04-25 ENCOUNTER — Encounter: Payer: Self-pay | Admitting: Family Medicine

## 2023-06-02 ENCOUNTER — Encounter: Payer: Self-pay | Admitting: Family Medicine

## 2023-06-02 ENCOUNTER — Ambulatory Visit (INDEPENDENT_AMBULATORY_CARE_PROVIDER_SITE_OTHER): Admitting: Family Medicine

## 2023-06-02 VITALS — BP 110/78 | HR 81 | Ht 65.0 in | Wt 165.8 lb

## 2023-06-02 DIAGNOSIS — S29012A Strain of muscle and tendon of back wall of thorax, initial encounter: Secondary | ICD-10-CM

## 2023-06-02 MED ORDER — CYCLOBENZAPRINE HCL 5 MG PO TABS: 5.0000 mg | ORAL_TABLET | Freq: Three times a day (TID) | ORAL | 0 refills | Status: AC | PRN

## 2023-06-02 MED ORDER — DICLOFENAC SODIUM 50 MG PO TBEC: 50.0000 mg | DELAYED_RELEASE_TABLET | Freq: Two times a day (BID) | ORAL | 0 refills | Status: DC | PRN

## 2023-06-02 NOTE — Assessment & Plan Note (Signed)
 History of Present Illness Lunabella Badgett is a 28 year old female who presents with right-sided mid-back pain and difficulty breathing.  She has been experiencing right-sided mid-back pain for the past three days, originating around the mid shoulder blade area, more on the right side, and began suddenly upon waking up. The pain is severe, making it difficult for her to take a full breath, and she describes it as feeling as though she 'couldn't breathe for a while' due to the intensity.  The pain has persisted without significant improvement and is primarily localized to the right side of her back, occasionally radiating to the front. It is exacerbated by deep breaths and certain movements, such as driving or turning her head. She has been unable to sleep well due to the pain, often waking up in the middle of the night with 'excruciating pain'.  She has been using [employer], Biofreeze, and ibuprofen to manage the pain. Ibuprofen provides temporary relief for about one to two hours, particularly when taken before work. Her work involves sitting for extended periods, which she feels may contribute to the stiffness and discomfort.  She denies any recent physical activities or changes in routine that could have triggered the pain. She has a history of lower back pain but has not experienced similar pain in the mid-back region before.  No numbness or tingling in the torso or upper arms. Pain in the right paraspinal cervical region with leftward torsion of the cervical spine.  Physical Exam PALPATION: Tenderness medial to the inferior scapular border on the right at the rhomboid major. Midline and left medial scapular border are benign. RANGE OF MOTION: Symmetric torso rotation bilaterally with rightward torsion eliciting pain localized to the scapular region. Full symmetric abduction elicits minor discomfort at the scapular region. Full external rotation is symmetric and painless.  Leftward torsion at the  cervical spine elicits right paraspinal cervical pain. STRENGTH: Resisted cross body on the left elicits minor discomfort on the right. Cross body on the right recreates pain. NEUROVASCULAR: Negative Spurling's test bilaterally.  Assessment and Plan Right Rhomboid muscle spasm Acute right-sided rhomboid muscle spasm due to muscle imbalance and postural issues, part of upper cross syndrome. Addressing muscle imbalance is crucial to prevent recurrence. Initial treatment focuses on symptom relief; long-term management requires strengthening and stretching exercises. - Prescribed diclofenac twice daily for 3-4 days, then twice daily as needed, with food. - Prescribed Flexeril 5 mg, 1-2 tablets as needed, primarily at night.  Side effect can be drowsiness - Advised against using ibuprofen while on diclofenac. - Recommended continuing Biofreeze and other topical treatments. - Provided exercises for muscle imbalance: shoulder blade retraction, pectoral stretching, chin tucks. - Advised heat therapy and massage techniques. - Discussed potential for trigger point injection if no improvement by early to mid next week.  Patient to contact our office next week if symptoms fail to improve to discuss these and other steps. - Instructed to follow up if symptoms persist or worsen.

## 2023-06-02 NOTE — Progress Notes (Signed)
 Primary Care / Sports Medicine Office Visit  Patient Information:  Patient ID: Amy Huff, female DOB: 1995/04/26 Age: 28 y.o. MRN: [id number]   Amy Huff is a pleasant 28 y.o. female presenting with the following:  Chief Complaint  Patient presents with   Back Pain    Upper right scapula pain x 3 days. NKI. Aggravating factors are breathing, lifting and sleeping. Patient has used Biofreeze, icy hot and ibuprofen for pain relief. The pian is sharp and has got a little better. Pain level is a 5/10 today.     Vitals:   06/02/23 0806  BP: 110/78  Pulse: 81  SpO2: 97%   Vitals:   06/02/23 0806  Weight: 165 lb 12.8 oz (75.2 kg)  Height: 5\' 5"  (1.651 m)   Body mass index is 27.59 kg/m.  No results found.   Independent interpretation of notes and tests performed by another provider:   None  Procedures performed:   None  Pertinent History, Exam, Impression, and Recommendations:   Problem List Items Addressed This Visit     Strain of right rhomboid muscle - Primary   History of Present Illness Amy Huff is a 28 year old female who presents with right-sided mid-back pain and difficulty breathing.  She has been experiencing right-sided mid-back pain for the past three days, originating around the mid shoulder blade area, more on the right side, and began suddenly upon waking up. The pain is severe, making it difficult for her to take a full breath, and she describes it as feeling as though she 'couldn't breathe for a while' due to the intensity.  The pain has persisted without significant improvement and is primarily localized to the right side of her back, occasionally radiating to the front. It is exacerbated by deep breaths and certain movements, such as driving or turning her head. She has been unable to sleep well due to the pain, often waking up in the middle of the night with 'excruciating pain'.  She has been using [employer], Biofreeze, and ibuprofen to  manage the pain. Ibuprofen provides temporary relief for about one to two hours, particularly when taken before work. Her work involves sitting for extended periods, which she feels may contribute to the stiffness and discomfort.  She denies any recent physical activities or changes in routine that could have triggered the pain. She has a history of lower back pain but has not experienced similar pain in the mid-back region before.  No numbness or tingling in the torso or upper arms. Pain in the right paraspinal cervical region with leftward torsion of the cervical spine.  Physical Exam PALPATION: Tenderness medial to the inferior scapular border on the right at the rhomboid major. Midline and left medial scapular border are benign. RANGE OF MOTION: Symmetric torso rotation bilaterally with rightward torsion eliciting pain localized to the scapular region. Full symmetric abduction elicits minor discomfort at the scapular region. Full external rotation is symmetric and painless.  Leftward torsion at the cervical spine elicits right paraspinal cervical pain. STRENGTH: Resisted cross body on the left elicits minor discomfort on the right. Cross body on the right recreates pain. NEUROVASCULAR: Negative Spurling's test bilaterally.  Assessment and Plan Right Rhomboid muscle spasm Acute right-sided rhomboid muscle spasm due to muscle imbalance and postural issues, part of upper cross syndrome. Addressing muscle imbalance is crucial to prevent recurrence. Initial treatment focuses on symptom relief; long-term management requires strengthening and stretching exercises. - Prescribed diclofenac twice daily for  3-4 days, then twice daily as needed, with food. - Prescribed Flexeril 5 mg, 1-2 tablets as needed, primarily at night.  Side effect can be drowsiness - Advised against using ibuprofen while on diclofenac. - Recommended continuing Biofreeze and other topical treatments. - Provided exercises for muscle  imbalance: shoulder blade retraction, pectoral stretching, chin tucks. - Advised heat therapy and massage techniques. - Discussed potential for trigger point injection if no improvement by early to mid next week.  Patient to contact our office next week if symptoms fail to improve to discuss these and other steps. - Instructed to follow up if symptoms persist or worsen.        Orders & Medications Medications:  Meds ordered this encounter  Medications   diclofenac (VOLTAREN) 50 MG EC tablet    Sig: Take 1 tablet (50 mg total) by mouth 2 (two) times daily as needed.    Dispense:  60 tablet    Refill:  0   cyclobenzaprine (FLEXERIL) 5 MG tablet    Sig: Take 1-2 tablets (5-10 mg total) by mouth 3 (three) times daily as needed for muscle spasms.    Dispense:  30 tablet    Refill:  0   No orders of the defined types were placed in this encounter.    Return in about 2 months (around 08/02/2023) for CPE.     Jerrol Banana, MD, [facility]   Primary Care Sports Medicine Primary Care and Sports Medicine at [facility]

## 2023-06-02 NOTE — Patient Instructions (Addendum)
 Patient Care Plan  Right Rhomboid Muscle Spasm  1. Take diclofenac twice daily for 3-4 days with food, then as needed. 2. Take Flexeril (5 mg), 1-2 tablets as needed, mostly at night. Be aware it may cause drowsiness. 3. Do not use ibuprofen or naproxen while taking diclofenac. 4. Continue using Biofreeze and other topical treatments as needed. 5. Perform exercises to address muscle imbalance: shoulder blade retraction, pectoral stretching, and chin tucks. 6. Use heat therapy and massage techniques for additional relief. 7. If symptoms do not improve by early to mid next week, contact our office to discuss the possibility of a trigger point injection.  Red Flags  - If symptoms persist or worsen, follow up with your healthcare provider.

## 2023-06-29 ENCOUNTER — Other Ambulatory Visit: Payer: Self-pay | Admitting: Family Medicine

## 2023-06-29 DIAGNOSIS — S29012A Strain of muscle and tendon of back wall of thorax, initial encounter: Secondary | ICD-10-CM

## 2023-06-29 NOTE — Telephone Encounter (Signed)
 Requested medications are due for refill today.  yes  Requested medications are on the active medications list.  yes  Last refill. 06/02/2023 #60 0 rf  Future visit scheduled.   yes  Notes to clinic.  New medication to this pt. Rx written to expire 07/02/2023.    Requested Prescriptions  Pending Prescriptions Disp Refills   diclofenac (VOLTAREN) 50 MG EC tablet [Pharmacy Med Name: DICLOFENAC SOD EC 50 MG TAB] 60 tablet 0    Sig: TAKE 1 TABLET BY MOUTH 2 TIMES DAILY AS NEEDED.     Analgesics:  NSAIDS Failed - 06/29/2023  5:33 PM      Failed - Manual Review: Labs are only required if the patient has taken medication for more than 8 weeks.      Failed - Cr in normal range and within 360 days    Creatinine, Ser  Date Value Ref Range Status  04/21/2022 0.78 0.57 - 1.00 mg/dL Final         Failed - HGB in normal range and within 360 days    Hemoglobin  Date Value Ref Range Status  04/21/2022 12.9 11.1 - 15.9 g/dL Final         Failed - PLT in normal range and within 360 days    Platelets  Date Value Ref Range Status  04/21/2022 367 150 - 450 x10E3/uL Final         Failed - HCT in normal range and within 360 days    Hematocrit  Date Value Ref Range Status  04/21/2022 38.0 34.0 - 46.6 % Final         Failed - eGFR is 30 or above and within 360 days    eGFR  Date Value Ref Range Status  04/21/2022 107 >59 mL/min/1.73 Final         Failed - Valid encounter within last 12 months    Recent Outpatient Visits           3 weeks ago Strain of right rhomboid muscle   [facility] Augustus Ledger, Dessie Flow, MD       Future Appointments             In 3 months Augustus Ledger, Dessie Flow, MD [facility] Primary Care & Sports Medicine at [facility], [facility]            Passed - Patient is not pregnant

## 2023-10-11 ENCOUNTER — Encounter: Admitting: Family Medicine
